# Patient Record
Sex: Male | Born: 1990 | Race: Black or African American | Hispanic: No | Marital: Single | Smoking: Never smoker
Health system: Southern US, Community
[De-identification: ages and names within clinical notes are randomized; demographics above are authoritative.]

---
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
## 2016-10-31 ENCOUNTER — Ambulatory Visit (INDEPENDENT_AMBULATORY_CARE_PROVIDER_SITE_OTHER): Payer: Self-pay | Admitting: Family Medicine

## 2016-10-31 ENCOUNTER — Encounter: Payer: Self-pay | Admitting: Family Medicine

## 2016-10-31 VITALS — BP 108/80 | HR 59 | Temp 98.3°F | Resp 16 | Ht 66.75 in | Wt 147.0 lb

## 2016-10-31 DIAGNOSIS — Z1389 Encounter for screening for other disorder: Secondary | ICD-10-CM

## 2016-10-31 DIAGNOSIS — Z1383 Encounter for screening for respiratory disorder NEC: Secondary | ICD-10-CM

## 2016-10-31 DIAGNOSIS — Z136 Encounter for screening for cardiovascular disorders: Secondary | ICD-10-CM

## 2016-10-31 DIAGNOSIS — Z1211 Encounter for screening for malignant neoplasm of colon: Secondary | ICD-10-CM

## 2016-10-31 DIAGNOSIS — Z130 Encounter for screening for diseases of the blood and blood-forming organs and certain disorders involving the immune mechanism: Secondary | ICD-10-CM

## 2016-10-31 DIAGNOSIS — Z1329 Encounter for screening for other suspected endocrine disorder: Secondary | ICD-10-CM

## 2016-10-31 DIAGNOSIS — N410 Acute prostatitis: Secondary | ICD-10-CM

## 2016-10-31 DIAGNOSIS — Z1212 Encounter for screening for malignant neoplasm of rectum: Secondary | ICD-10-CM

## 2016-10-31 DIAGNOSIS — Z113 Encounter for screening for infections with a predominantly sexual mode of transmission: Secondary | ICD-10-CM

## 2016-10-31 DIAGNOSIS — Z125 Encounter for screening for malignant neoplasm of prostate: Secondary | ICD-10-CM

## 2016-10-31 DIAGNOSIS — Z0000 Encounter for general adult medical examination without abnormal findings: Secondary | ICD-10-CM

## 2016-10-31 LAB — LIPID PANEL
CHOL/HDL RATIO: 2.2 ratio (ref <=5.0)
Cholesterol: 116 mg/dL — ABNORMAL LOW (ref 125–200)
HDL: 52 mg/dL (ref >=40)
LDL Cholesterol: 53 mg/dL (ref <130)
Triglycerides: 54 mg/dL (ref <150)
VLDL: 11 mg/dL (ref <30)

## 2016-10-31 LAB — COMPREHENSIVE METABOLIC PANEL
ALK PHOS: 42 U/L (ref 40–115)
ALT: 17 U/L (ref 9–46)
AST: 19 U/L (ref 10–40)
Albumin: 5 g/dL (ref 3.6–5.1)
BUN: 12 mg/dL (ref 7–25)
CALCIUM: 9.7 mg/dL (ref 8.6–10.3)
CHLORIDE: 102 mmol/L (ref 98–110)
CO2: 27 mmol/L (ref 20–31)
Creat: 1.09 mg/dL (ref 0.60–1.35)
GLUCOSE: 75 mg/dL (ref 65–99)
POTASSIUM: 4.2 mmol/L (ref 3.5–5.3)
Sodium: 139 mmol/L (ref 135–146)
Total Bilirubin: 2.4 mg/dL — ABNORMAL HIGH (ref 0.2–1.2)
Total Protein: 7.8 g/dL (ref 6.1–8.1)

## 2016-10-31 LAB — POCT URINALYSIS DIP (MANUAL ENTRY)
GLUCOSE UA: NEGATIVE
Leukocytes, UA: NEGATIVE
Nitrite, UA: NEGATIVE
Protein Ur, POC: 30 — AB
RBC UA: NEGATIVE
SPEC GRAV UA: 1.025
UROBILINOGEN UA: 1
pH, UA: 5.5

## 2016-10-31 LAB — CBC
HEMATOCRIT: 43.7 % (ref 38.5–50.0)
Hemoglobin: 14.9 g/dL (ref 13.2–17.1)
MCH: 31.8 pg (ref 27.0–33.0)
MCHC: 34.1 g/dL (ref 32.0–36.0)
MCV: 93.4 fL (ref 80.0–100.0)
MPV: 11.2 fL (ref 7.5–12.5)
Platelets: 217 10*3/uL (ref 140–400)
RBC: 4.68 MIL/uL (ref 4.20–5.80)
RDW: 12.3 % (ref 11.0–15.0)
WBC: 4.8 10*3/uL (ref 3.8–10.8)

## 2016-10-31 LAB — HIV ANTIBODY (ROUTINE TESTING W REFLEX): HIV 1&2 Ab, 4th Generation: NONREACTIVE

## 2016-10-31 LAB — TSH: TSH: 0.93 m[IU]/L (ref 0.40–4.50)

## 2016-10-31 LAB — PSA: PSA: 0.8 ng/mL (ref <=4.0)

## 2016-10-31 LAB — HEPATITIS C ANTIBODY: HCV Ab: NEGATIVE

## 2016-10-31 MED ORDER — DOXYCYCLINE HYCLATE 100 MG PO CAPS: 100.0000 mg | ORAL_CAPSULE | Freq: Two times a day (BID) | ORAL | 0 refills | Status: DC

## 2016-10-31 NOTE — Patient Instructions (Addendum)
IF you received an x-ray today, you will receive an invoice from [facility]. Please contact [facility] at [phone] with questions or concerns regarding your invoice.   IF you received labwork today, you will receive an invoice from [employer]. Please contact [facility] at [phone] with questions or concerns regarding your invoice.   Our billing staff will not be able to assist you with questions regarding bills from these companies.  You will be contacted with the lab results as soon as they are available. The fastest way to get your results is to activate your My Chart account. Instructions are located on the last page of this paperwork. If you have not heard from [place] regarding the results in 2 weeks, please contact this office.     Prostatitis The prostate gland is about the size and shape of a walnut. It is located just below your bladder. It produces one of the components of semen, which is made up of sperm and the fluids that help nourish and transport it out from the testicles. Prostatitis is inflammation of the prostate gland.  There are four types of prostatitis:  Acute bacterial prostatitis. This is the least common type of prostatitis. It starts quickly and usually is associated with a bladder infection, high fever, and shaking chills. It can occur at any age.  Chronic bacterial prostatitis. This is a persistent bacterial infection in the prostate. It usually develops from repeated acute bacterial prostatitis or acute bacterial prostatitis that was not properly treated. It can occur in men of any age but is most common in middle-aged men whose prostate has begun to enlarge. The symptoms are not as severe as those in acute bacterial prostatitis. Discomfort in the part of your body that is in front of your rectum and below your scrotum (perineum), lower abdomen, or in the head of your penis (glans) may represent your primary  discomfort.  Chronic prostatitis (nonbacterial). This is the most common type of prostatitis. It is inflammation of the prostate gland that is not caused by a bacterial infection. The cause is unknown and may be associated with a viral infection or autoimmune disorder.  Prostatodynia (pelvic floor disorder). This is associated with increased muscular tone in the pelvis surrounding the prostate. CAUSES The causes of bacterial prostatitis are bacterial infection. The causes of the other types of prostatitis are unknown.  SYMPTOMS  Symptoms can vary depending upon the type of prostatitis that exists. There can also be overlap in symptoms. Possible symptoms for each type of prostatitis are listed below. Acute Bacterial Prostatitis  Painful urination.  Fever or chills.  Muscle or joint pains.  Low back pain.  Low abdominal pain.  Inability to empty bladder completely. Chronic Bacterial Prostatitis, Chronic Nonbacterial Prostatitis, and Prostatodynia  Sudden urge to urinate.  Frequent urination.  Difficulty starting urine stream.  Weak urine stream.  Discharge from the urethra.  Dribbling after urination.  Rectal pain.  Pain in the testicles, penis, or tip of the penis.  Pain in the perineum.  Problems with sexual function.  Painful ejaculation.  Bloody semen. DIAGNOSIS  In order to diagnose prostatitis, your health care provider will ask about your symptoms. One or more urine samples will be taken and tested (urinalysis). If the urinalysis result is negative for bacteria, your health care provider may use a finger to feel your prostate (digital rectal exam). This exam helps your health care provider determine if your prostate is swollen and tender. It  will also produce a specimen of semen that can be analyzed. TREATMENT  Treatment for prostatitis depends on the cause. If a bacterial infection is the cause, it can be treated with antibiotic medicine. In cases of chronic  bacterial prostatitis, the use of antibiotics for up to 1 month or 6 weeks may be necessary. Your health care provider may instruct you to take sitz baths to help relieve pain. A sitz bath is a bath of hot water in which your hips and buttocks are under water. This relaxes the pelvic floor muscles and often helps to relieve the pressure on your prostate. HOME CARE INSTRUCTIONS   Take all medicines as directed by your health care provider.  Take sitz baths as directed by your health care provider. SEEK MEDICAL CARE IF:   Your symptoms get worse, not better.  You have a fever. SEEK IMMEDIATE MEDICAL CARE IF:   You have chills.  You feel nauseous or vomit.  You feel lightheaded or faint.  You are unable to urinate.  You have blood or blood clots in your urine. MAKE SURE YOU:  Understand these instructions.  Will watch your condition.  Will get help right away if you are not doing well or get worse.   This information is not intended to replace advice given to you by your health care provider. Make sure you discuss any questions you have with your health care provider.   Document Released: 12/14/2000 Document Revised: 01/07/2015 Document Reviewed: 07/06/2013 Elsevier Interactive Patient Education 2016 [employer] Health Maintenance, Male A healthy lifestyle and preventative care can promote health and wellness.  Maintain regular health, dental, and eye exams.  Eat a healthy diet. Foods like vegetables, fruits, whole grains, low-fat dairy products, and lean protein foods contain the nutrients you need and are low in calories. Decrease your intake of foods high in solid fats, added sugars, and salt. Get information about a proper diet from your health care provider, if necessary.  Regular physical exercise is one of the most important things you can do for your health. Most adults should get at least 150 minutes of moderate-intensity exercise (any activity that increases your  heart rate and causes you to sweat) each week. In addition, most adults need muscle-strengthening exercises on 2 or more days a week.   Maintain a healthy weight. The body mass index (BMI) is a screening tool to identify possible weight problems. It provides an estimate of body fat based on height and weight. Your health care provider can find your BMI and can help you achieve or maintain a healthy weight. For males 20 years and older:  A BMI below 18.5 is considered underweight.  A BMI of 18.5 to 24.9 is normal.  A BMI of 25 to 29.9 is considered overweight.  A BMI of 30 and above is considered obese.  Maintain normal blood lipids and cholesterol by exercising and minimizing your intake of saturated fat. Eat a balanced diet with plenty of fruits and vegetables. Blood tests for lipids and cholesterol should begin at age 26 and be repeated every 5 years. If your lipid or cholesterol levels are high, you are over age 26, or you are at high risk for heart disease, you may need your cholesterol levels checked more frequently.Ongoing high lipid and cholesterol levels should be treated with medicines if diet and exercise are not working.  If you smoke, find out from your health care provider how to quit. If you do not use tobacco, do  not start.  Lung cancer screening is recommended for adults aged 55-80 years who are at high risk for developing lung cancer because of a history of smoking. A yearly low-dose CT scan of the lungs is recommended for people who have at least a 30-pack-year history of smoking and are current smokers or have quit within the past 15 years. A pack year of smoking is smoking an average of 1 pack of cigarettes a day for 1 year (for example, a 30-pack-year history of smoking could mean smoking 1 pack a day for 30 years or 2 packs a day for 15 years). Yearly screening should continue until the smoker has stopped smoking for at least 15 years. Yearly screening should be stopped for  people who develop a health problem that would prevent them from having lung cancer treatment.  If you choose to drink alcohol, do not have more than 2 drinks per day. One drink is considered to be 12 oz (360 mL) of beer, 5 oz (150 mL) of wine, or 1.5 oz (45 mL) of liquor.  Avoid the use of street drugs. Do not share needles with anyone. Ask for help if you need support or instructions about stopping the use of drugs.  High blood pressure causes heart disease and increases the risk of stroke. High blood pressure is more likely to develop in:  People who have blood pressure in the end of the normal range (100-139/85-89 mm Hg).  People who are overweight or obese.  People who are African American.  If you are 1-51 years of age, have your blood pressure checked every 3-5 years. If you are 22 years of age or older, have your blood pressure checked every year. You should have your blood pressure measured twice--once when you are at a hospital or clinic, and once when you are not at a hospital or clinic. Record the average of the two measurements. To check your blood pressure when you are not at a hospital or clinic, you can use:  An automated blood pressure machine at a pharmacy.  A home blood pressure monitor.  If you are 75-3 years old, ask your health care provider if you should take aspirin to prevent heart disease.  Diabetes screening involves taking a blood sample to check your fasting blood sugar level. This should be done once every 3 years after age 65 if you are at a normal weight and without risk factors for diabetes. Testing should be considered at a younger age or be carried out more frequently if you are overweight and have at least 1 risk factor for diabetes.  Colorectal cancer can be detected and often prevented. Most routine colorectal cancer screening begins at the age of 89 and continues through age 10. However, your health care provider may recommend screening at an earlier  age if you have risk factors for colon cancer. On a yearly basis, your health care provider may provide home test kits to check for hidden blood in the stool. A small camera at the end of a tube may be used to directly examine the colon (sigmoidoscopy or colonoscopy) to detect the earliest forms of colorectal cancer. Talk to your health care provider about this at age 106 when routine screening begins. A direct exam of the colon should be repeated every 5-10 years through age 89, unless early forms of precancerous polyps or small growths are found.  People who are at an increased risk for hepatitis B should be screened for this virus.  You are considered at high risk for hepatitis B if:  You were born in a country where hepatitis B occurs often. Talk with your health care provider about which countries are considered high risk.  Your parents were born in a high-risk country and you have not received a shot to protect against hepatitis B (hepatitis B vaccine).  You have HIV or AIDS.  You use needles to inject street drugs.  You live with, or have sex with, someone who has hepatitis B.  You are a man who has sex with other men (MSM).  You get hemodialysis treatment.  You take certain medicines for conditions like cancer, organ transplantation, and autoimmune conditions.  Hepatitis C blood testing is recommended for all people born from [birth year] through 1965 and any individual with known risk factors for hepatitis C.  Healthy men should no longer receive prostate-specific antigen (PSA) blood tests as part of routine cancer screening. Talk to your health care provider about prostate cancer screening.  Testicular cancer screening is not recommended for adolescents or adult males who have no symptoms. Screening includes self-exam, a health care provider exam, and other screening tests. Consult with your health care provider about any symptoms you have or any concerns you have about testicular  cancer.  Practice safe sex. Use condoms and avoid high-risk sexual practices to reduce the spread of sexually transmitted infections (STIs).  You should be screened for STIs, including gonorrhea and chlamydia if:  You are sexually active and are younger than 24 years.  You are older than 24 years, and your health care provider tells you that you are at risk for this type of infection.  Your sexual activity has changed since you were last screened, and you are at an increased risk for chlamydia or gonorrhea. Ask your health care provider if you are at risk.  If you are at risk of being infected with HIV, it is recommended that you take a prescription medicine daily to prevent HIV infection. This is called pre-exposure prophylaxis (PrEP). You are considered at risk if:  You are a man who has sex with other men (MSM).  You are a heterosexual man who is sexually active with multiple partners.  You take drugs by injection.  You are sexually active with a partner who has HIV.  Talk with your health care provider about whether you are at high risk of being infected with HIV. If you choose to begin PrEP, you should first be tested for HIV. You should then be tested every 3 months for as long as you are taking PrEP.  Use sunscreen. Apply sunscreen liberally and repeatedly throughout the day. You should seek shade when your shadow is shorter than you. Protect yourself by wearing long sleeves, pants, a wide-brimmed hat, and sunglasses year round whenever you are outdoors.  Tell your health care provider of new moles or changes in moles, especially if there is a change in shape or color. Also, tell your health care provider if a mole is larger than the size of a pencil eraser.  A one-time screening for abdominal aortic aneurysm (AAA) and surgical repair of large AAAs by ultrasound is recommended for men aged 65-75 years who are current or former smokers.  Stay current with your vaccines  (immunizations).   This information is not intended to replace advice given to you by your health care provider. Make sure you discuss any questions you have with your health care provider.   Document Released: 06/14/2008 Document  Revised: 01/07/2015 Document Reviewed: 05/14/2011 Elsevier Interactive Patient Education 2016 Elsevier Inc.  

## 2016-10-31 NOTE — Progress Notes (Signed)
Subjective:    Patient ID: Richard Bonilla, male    DOB: 1991-09-22, 25 y.o.   MRN: [id number] Chief Complaint  Patient presents with  . Annual Exam    HPI  Drinks a lot ofwater and so has some urinary frequency. Works as a Technical sales engineermusician. Works out daily Fasting No other urnary changes Last STD screen sev yrs ago Occ urinary uregency  Vegan/pescetarian - good diet,   Depression screen PHQ 2/9 10/31/2016  Decreased Interest 0  Down, Depressed, Hopeless 0  PHQ - 2 Score 0    No past medical history on file. No past surgical history on file. No current outpatient prescriptions on file prior to visit.   No current facility-administered medications on file prior to visit.    No Known Allergies No family history on file. Social History   Social History  . Marital status: Single    Spouse name: N/A  . Number of children: N/A  . Years of education: N/A   Social History Main Topics  . Smoking status: Never Smoker  . Smokeless tobacco: None  . Alcohol use None  . Drug use: Unknown  . Sexual activity: Not Asked   Other Topics Concern  . None   Social History Narrative  . None    Review of Systems  Constitutional: Negative for activity change and appetite change.  Endocrine: Positive for polyuria. Negative for polydipsia and polyphagia.  Genitourinary: Positive for frequency, testicular pain and urgency. Negative for decreased urine volume, difficulty urinating, discharge, dysuria, enuresis, flank pain, genital sores, hematuria, penile pain, penile swelling and scrotal swelling.  Psychiatric/Behavioral: Negative for dysphoric mood. The patient is not nervous/anxious.   All other systems reviewed and are negative.      Objective:   Physical Exam  Constitutional: He is oriented to person, place, and time. He appears well-developed and well-nourished. No distress.  HENT:  Head: Normocephalic and atraumatic.  Right Ear: Tympanic membrane, external ear and ear canal  normal.  Left Ear: Tympanic membrane, external ear and ear canal normal.  Nose: Nose normal.  Mouth/Throat: Uvula is midline, oropharynx is clear and moist and mucous membranes are normal. No oropharyngeal exudate.  Eyes: Conjunctivae are normal. Right eye exhibits no discharge. Left eye exhibits no discharge. No scleral icterus.  Neck: Normal range of motion. Neck supple. No thyromegaly present.  Cardiovascular: Normal rate, regular rhythm, normal heart sounds and intact distal pulses.   Pulmonary/Chest: Effort normal and breath sounds normal. No respiratory distress.  Abdominal: Soft. Bowel sounds are normal. He exhibits no distension and no mass. There is no tenderness. There is no rebound and no guarding.  Genitourinary: Rectal exam shows tenderness. Rectal exam shows no external hemorrhoid, no internal hemorrhoid and no mass. Prostate is tender. Prostate is not enlarged.  Musculoskeletal: He exhibits no edema.  Lymphadenopathy:    He has no cervical adenopathy.  Neurological: He is alert and oriented to person, place, and time. He has normal reflexes. No cranial nerve deficit. He exhibits normal muscle tone.  Skin: Skin is warm and dry. No rash noted. He is not diaphoretic. No erythema.  Psychiatric: He has a normal mood and affect. His behavior is normal.      BP 108/80   Pulse (!) 59   Temp 98.3 F (36.8 C) (Oral)   Resp 16   Ht 5' 6.75" (1.695 m)   Wt 147 lb (66.7 kg)   SpO2 99%   BMI 23.20 kg/m      Assessment &  Plan:   1. Annual physical exam   2. Routine screening for STI (sexually transmitted infection)   3. Screening for cardiovascular, respiratory, and genitourinary diseases   4. Screening for colorectal cancer   5. Screening for deficiency anemia   6. Screening for prostate cancer   7. Screening for thyroid disorder   8. Acute prostatitis     Orders Placed This Encounter  Procedures  . Urine culture  . GC/Chlamydia Probe Amp  . PSA  . RPR  . TSH  .  HIV antibody  . Hepatitis C Antibody  . CBC  . Comprehensive metabolic panel    Order Specific Question:   Has the patient fasted?    Answer:   Yes  . Lipid panel    Order Specific Question:   Has the patient fasted?    Answer:   Yes  . Care order/instruction:    AVS and GO    Scheduling Instructions:     AVS and GO  . POCT urinalysis dipstick  . POCT Microscopic Urinalysis ([facility])    Meds ordered this encounter  Medications  . doxycycline (VIBRAMYCIN) 100 MG capsule    Sig: Take 1 capsule (100 mg total) by mouth 2 (two) times daily.    Dispense:  42 capsule    Refill:  0      Norberto SorensonEva Onisha Cedeno, M.D.  [facility]  [place] [address] [place], [place] [address] [phone] phone [phone] fax  11/05/16 12:04 AM

## 2016-11-01 LAB — URINE CULTURE: ORGANISM ID, BACTERIA: NO GROWTH

## 2016-11-01 LAB — GC/CHLAMYDIA PROBE AMP
CT PROBE, AMP APTIMA: NOT DETECTED
GC PROBE AMP APTIMA: NOT DETECTED

## 2016-11-01 LAB — RPR

## 2016-11-05 ENCOUNTER — Encounter: Payer: Self-pay | Admitting: Family Medicine

## 2016-11-07 ENCOUNTER — Ambulatory Visit (INDEPENDENT_AMBULATORY_CARE_PROVIDER_SITE_OTHER): Payer: Self-pay | Admitting: Emergency Medicine

## 2016-11-07 ENCOUNTER — Ambulatory Visit (INDEPENDENT_AMBULATORY_CARE_PROVIDER_SITE_OTHER): Payer: Self-pay

## 2016-11-07 VITALS — BP 128/82 | HR 88 | Temp 98.2°F | Resp 16 | Wt 144.8 lb

## 2016-11-07 DIAGNOSIS — R1030 Lower abdominal pain, unspecified: Secondary | ICD-10-CM

## 2016-11-07 DIAGNOSIS — N410 Acute prostatitis: Secondary | ICD-10-CM

## 2016-11-07 LAB — POCT URINALYSIS DIP (MANUAL ENTRY)
BILIRUBIN UA: NEGATIVE
Bilirubin, UA: NEGATIVE
Blood, UA: NEGATIVE
Glucose, UA: NEGATIVE
LEUKOCYTES UA: NEGATIVE
NITRITE UA: NEGATIVE
PROTEIN UA: NEGATIVE
Spec Grav, UA: 1.015
Urobilinogen, UA: 0.2
pH, UA: 5.5

## 2016-11-07 LAB — POC MICROSCOPIC URINALYSIS (UMFC): MUCUS RE: ABSENT

## 2016-11-07 NOTE — Patient Instructions (Addendum)
Please make the dietary changes we discussed. Please call if no better in 2 weeks we'll proceed with CT abdomen pelvis.Irritable Bowel Syndrome, Adult Irritable bowel syndrome (IBS) is not one specific disease. It is a group of symptoms that affects the organs responsible for digestion (gastrointestinal or GI tract).  To regulate how your GI tract works, your body sends signals back and forth between your intestines and your brain. If you have IBS, there may be a problem with these signals. As a result, your GI tract does not function normally. Your intestines may become more sensitive and overreact to certain things. This is especially true when you eat certain foods or when you are under stress.  There are four types of IBS. These may be determined based on the consistency of your stool:   IBS with diarrhea.   IBS with constipation.   Mixed IBS.   Unsubtyped IBS.  It is important to know which type of IBS you have. Some treatments are more likely to be helpful for certain types of IBS.  CAUSES  The exact cause of IBS is not known. RISK FACTORS You may have a higher risk of IBS if:  You are a woman.  You are younger than 26 years old.  You have a family history of IBS.  You have mental health problems.  You have had bacterial infection of your GI tract. SIGNS AND SYMPTOMS  Symptoms of IBS vary from person to person. The main symptom is abdominal pain or discomfort. Additional symptoms usually include one or more of the following:   Diarrhea, constipation, or both.   Abdominal swelling or bloating.   Feeling full or sick after eating a small or regular-size meal.   Frequent gas.   Mucus in the stool.   A feeling of having more stool left after a bowel movement.  Symptoms tend to come and go. They may be associated with stress, psychiatric conditions, or nothing at all.  DIAGNOSIS  There is no specific test to diagnose IBS. Your health care provider will make a  diagnosis based on a physical exam, medical history, and your symptoms. You may have other tests to rule out other conditions that may be causing your symptoms. These may include:   Blood tests.   X-rays.   CT scan.  Endoscopy and colonoscopy. This is a test in which your GI tract is viewed with a long, thin, flexible tube. TREATMENT There is no cure for IBS, but treatment can help relieve symptoms. IBS treatment often includes:   Changes to your diet, such as:  Eating more fiber.  Avoiding foods that cause symptoms.  Drinking more water.  Eating regular, medium-sized portioned meals.  Medicines. These may include:  Fiber supplements if you have constipation.  Medicine to control diarrhea (antidiarrheal medicines).  Medicine to help control muscle spasms in your GI tract (antispasmodic medicines).  Medicines to help with any mental health issues, such as antidepressants or tranquilizers.  Therapy.  Talk therapy may help with anxiety, depression, or other mental health issues that can make IBS symptoms worse.  Stress reduction.  Managing your stress can help keep symptoms under control. HOME CARE INSTRUCTIONS   Take medicines only as directed by your health care provider.  Eat a healthy diet.  Avoid foods and drinks with added sugar.  Include more whole grains, fruits, and vegetables gradually into your diet. This may be especially helpful if you have IBS with constipation.  Avoid any foods and drinks that  make your symptoms worse. These may include dairy products and caffeinated or carbonated drinks.  Do not eat large meals.  Drink enough fluid to keep your urine clear or pale yellow.  Exercise regularly. Ask your health care provider for recommendations of good activities for you.  Keep all follow-up visits as directed by your health care provider. This is important. SEEK MEDICAL CARE IF:   You have constant pain.  You have trouble or pain with  swallowing.  You have worsening diarrhea. SEEK IMMEDIATE MEDICAL CARE IF:   You have severe and worsening abdominal pain.   You have diarrhea and:   You have a rash, stiff neck, or severe headache.   You are irritable, sleepy, or difficult to awaken.   You are weak, dizzy, or extremely thirsty.   You have bright red blood in your stool or you have black tarry stools.   You have unusual abdominal swelling that is painful.   You vomit continuously.   You vomit blood (hematemesis).   You have both abdominal pain and a fever.    This information is not intended to replace advice given to you by your health care provider. Make sure you discuss any questions you have with your health care provider.   Document Released: 12/17/2005 Document Revised: 01/07/2015 Document Reviewed: 09/03/2014 Elsevier Interactive Patient Education 2016 [employer]     IF you received an x-ray today, you will receive an invoice from [facility]. Please contact [facility] at [phone] with questions or concerns regarding your invoice.   IF you received labwork today, you will receive an invoice from [facility]. Please contact [facility] at [phone] with questions or concerns regarding your invoice.   Our billing staff will not be able to assist you with questions regarding bills from these companies.  You will be contacted with the lab results as soon as they are available. The fastest way to get your results is to activate your My Chart account. Instructions are located on the last page of this paperwork. If you have not heard from us regarding the results in 2 weeks, please contact this office.

## 2016-11-07 NOTE — Progress Notes (Signed)
Chief Complaint:  Chief Complaint  Patient presents with  . Follow-up    pressure in lower abd. still on antibiotics for prostititis    HPI: Richard Bonilla is a 25 y.o. male [facility] complaining ofPersistent low abdominal pain. He states his original symptoms started about 2 months ago with a sensation of an urge to urinate. He has intermittently had aching in his prostate and testicles. He was seen by Dr. Clelia CroftShaw and started on doxycycline. He has had a change in his diet recently and is concerned possibly this is attributing to his low abdominal pain. He is able to urinate with a normal stream. He has having regular bowel movements. He does not feel ill. He denies any fever.  No past medical history on file. No past surgical history on file. Social History   Social History  . Marital status: Single    Spouse name: N/A  . Number of children: N/A  . Years of education: N/A   Social History Main Topics  . Smoking status: Never Smoker  . Smokeless tobacco: Never Used  . Alcohol use None  . Drug use: Unknown  . Sexual activity: Not Asked   Other Topics Concern  . None   Social History Narrative  . None   No family history on file. No Known Allergies Prior to Admission medications   Medication Sig Start Date End Date Taking? Authorizing Provider  doxycycline (VIBRAMYCIN) 100 MG capsule Take 1 capsule (100 mg total) by mouth 2 (two) times daily. 10/31/16  Yes Sherren MochaEva N Shaw, MD     ROS: The patient denies fevers, chills, night sweats, unintentional weight loss, chest pain, palpitations, wheezing, dyspnea on exertion, nausea, vomiting, abdominal pain, dysuria, hematuria, melena, numbness, weakness, or tingling.  All other systems have been reviewed and were otherwise negative with the exception of those mentioned in the HPI and as above.    PHYSICAL EXAM: Vitals:   11/07/16 0913  BP: 128/82  Pulse: 88  Resp: 16  Temp: 98.2 F (36.8 C)   Body mass index  is 22.85 kg/m.   General: Alert, no acute distress HEENT:  Normocephalic, atraumatic, oropharynx patent. [facility] Cardiovascular:  Regular rate and rhythm, no rubs murmurs or gallops.  No Carotid bruits, radial pulse intact. No pedal edema.  Respiratory: Clear to auscultation bilaterally.  No wheezes, rales, or rhonchi.  No cyanosis, no use of accessory musculature Abdominal: No organomegaly, abdomen is soft and non-tender, positive bowel sounds.  No masses. Musculoskeletal: Gait intact. No edema, tenderness Skin: No rashes. Neurologic: Facial musculature symmetric. Psychiatric: Patient acts appropriately throughout our interaction. Lymphatic: No cervical or submandibular lymphadenopathy    LABS: Results for orders placed or performed in visit on 11/07/16  POCT urinalysis dipstick  Result Value Ref Range   Color, UA yellow yellow   Clarity, UA clear clear   Glucose, UA negative negative   Bilirubin, UA negative negative   Ketones, POC UA negative negative   Spec Grav, UA 1.015    Blood, UA negative negative   pH, UA 5.5    Protein Ur, POC negative negative   Urobilinogen, UA 0.2    Nitrite, UA Negative Negative   Leukocytes, UA Negative Negative  POCT Microscopic Urinalysis ([facility])  Result Value Ref Range   WBC,UR,HPF,POC None None WBC/hpf   RBC,UR,HPF,POC None None RBC/hpf   Bacteria None None, Too numerous to count   Mucus Absent Absent   Epithelial Cells, UR  Per Microscopy None None, Too numerous to count cells/hpf    EKG/XRAY:   Primary read [facility]. Dg Abd Acute W/chest  Result Date: 11/07/2016 CLINICAL DATA:  Lower abdominal pain EXAM: DG ABDOMEN ACUTE W/ 1V CHEST COMPARISON:  None in PACs FINDINGS: The lungs are well-expanded and clear. The heart and pulmonary vascularity are normal. The mediastinum is normal in width. There is no pleural effusion. The bony thorax is unremarkable. Within the abdomen the bowel gas pattern is  normal. No abnormal soft tissue calcifications are observed. The bony structures are unremarkable. IMPRESSION: There is no acute intra-abdominal abnormality. There is no acute cardiopulmonary disease. Electronically Signed   By: David  SwazilandJordan M.D.   On: 11/07/2016 10:02    ASSESSMENT/PLAN: We'll treat as irritable bowel syndrome with dietary changes. He will increase the fiber in his diet. He will pay attention to lactose intake. If no better in 2 weeks he will call our office and we will schedule with a CT abdomen pelvis with contrast.   Gross sideeffects, risk and benefits, and alternatives of medications d/w patient. Patient is aware that all medications have potential sideeffects and we are unable to predict every sideeffect or drug-drug interaction that may occur.  Lesle ChrisSteven Kaleem Sartwell MD 11/07/2016 9:37 AM

## 2016-11-08 LAB — GC/CHLAMYDIA PROBE AMP
CT PROBE, AMP APTIMA: NOT DETECTED
GC PROBE AMP APTIMA: NOT DETECTED

## 2017-10-21 ENCOUNTER — Ambulatory Visit (INDEPENDENT_AMBULATORY_CARE_PROVIDER_SITE_OTHER): Payer: Self-pay | Admitting: Physician Assistant

## 2017-10-21 ENCOUNTER — Encounter: Payer: Self-pay | Admitting: Physician Assistant

## 2017-10-21 VITALS — BP 118/88 | HR 65 | Temp 98.3°F | Resp 16 | Ht 65.0 in | Wt 142.8 lb

## 2017-10-21 DIAGNOSIS — H5310 Unspecified subjective visual disturbances: Secondary | ICD-10-CM

## 2017-10-21 NOTE — Patient Instructions (Addendum)
  Your vision and exam are not telling of what is going on.  I will refer you to opthalmology.  Please await this contact. I would like you to refrain from playing vide games at this time.       IF you received an x-ray today, you will receive an invoice from [facility]. Please contact [facility] at [phone] with questions or concerns regarding your invoice.   IF you received labwork today, you will receive an invoice from [facility]. Please contact [facility] at [phone] with questions or concerns regarding your invoice.   Our billing staff will not be able to assist you with questions regarding bills from these companies.  You will be contacted with the lab results as soon as they are available. The fastest way to get your results is to activate your My Chart account. Instructions are located on the last page of this paperwork. If you have not heard from us regarding the results in 2 weeks, please contact this office.

## 2017-10-21 NOTE — Progress Notes (Signed)
PRIMARY CARE AT [facility] [address], [place] [address] [phone]  Date:  10/21/2017   Name:  Richard Bonilla   DOB:  09-04-1991   MRN:  [id number]  PCP:  Patient, No Pcp Per    History of Present Illness:  Richard Bonilla is a 26 y.o. male patient who presents to PCP with  Chief Complaint  Patient presents with  . Eye Problem    RIGHT - per patient feels like a strain  x 1 month     Patient has had pressure in his right eye for 1 month. He plays video games.   He stopped drinking 1 month ago as well Some mariijuana use.  He felt very intense pressure while he was smoking.  In the last week, he has stopped all cannabis use and it still has that pressure.  No change in vision.   He noted no new eye redness with this episode.  He has noticed some eye watering.   Last time, eyes checked 10 months ago approximately.   He wears glasses for the last year.   Aggravated by playing his video games.  Relieved by not playing. Eye drops which helped minimally.    There are no active problems to display for this patient.   No past medical history on file.  No past surgical history on file.  Social History  Substance Use Topics  . Smoking status: Never Smoker  . Smokeless tobacco: Never Used  . Alcohol use Not on file    No family history on file.  No Known Allergies  Medication list has been reviewed and updated.  No current outpatient prescriptions on file prior to visit.   No current facility-administered medications on file prior to visit.     ROS ROS otherwise unremarkable unless listed above.  Physical Examination: BP 118/88 (BP Location: Left Arm, Patient Position: Sitting, Cuff Size: Normal)   Pulse 65   Temp 98.3 F (36.8 C) (Oral)   Resp 16   Ht 5\' 5"  (1.651 m)   Wt 142 lb 12.8 oz (64.8 kg)   SpO2 100%   BMI 23.76 kg/m  Ideal Body Weight: Weight in (lb) to have BMI = 25: 149.9  Physical Exam  Constitutional: He is oriented to person, place, and time. He  appears well-developed and well-nourished. No distress.  HENT:  Head: Normocephalic and atraumatic.  Eyes: Pupils are equal, round, and reactive to light. Conjunctivae and EOM are normal.  Fundoscopic exam:      The right eye shows no AV nicking and no papilledema. The right eye shows no red reflex.  Slit lamp exam:      The right eye shows no foreign body.  Limited exam due lack of dilation.  Cardiovascular: Normal rate and regular rhythm.  Exam reveals no friction rub.   No murmur heard. Pulmonary/Chest: Effort normal. No respiratory distress.  Neurological: He is alert and oriented to person, place, and time.  Skin: Skin is warm and dry. He is not diaphoretic.  Psychiatric: He has a normal mood and affect. His behavior is normal.     Assessment and Plan: Richard Bonilla is a 26 y.o. male who is here today for cc of  Chief Complaint  Patient presents with  . Eye Problem    RIGHT - per patient feels like a strain  x 1 month  possible eye strain.  Referring to groat eye.  Appointment made.    Eye strain, right  [place], [place]  [facility] 10/23/20189:44 AM

## 2017-12-02 IMAGING — DX DG ABDOMEN ACUTE W/ 1V CHEST
3 series · 3 of 3 positions shown · non-contrast
Comparison: None in PACs

CLINICAL DATA: Lower abdominal pain

EXAM:
DG ABDOMEN ACUTE W/ 1V CHEST

[chest pa]
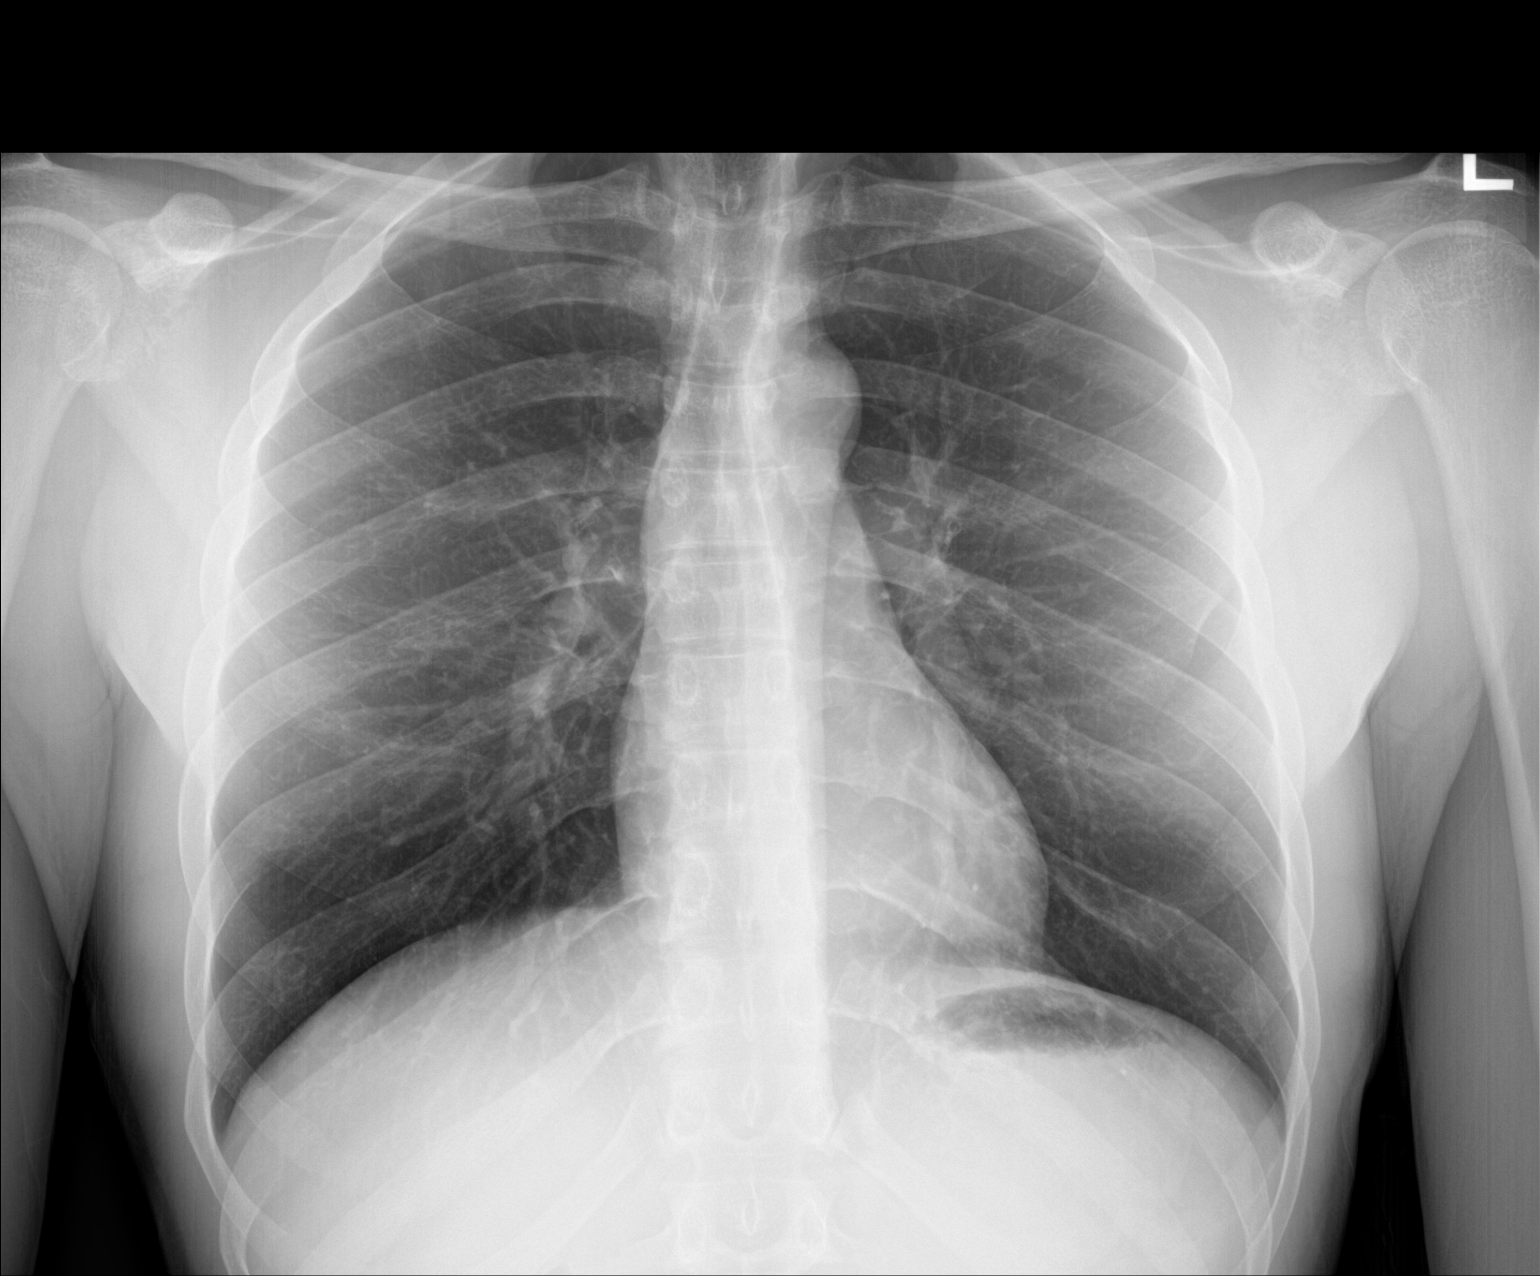

[abdomen erect]
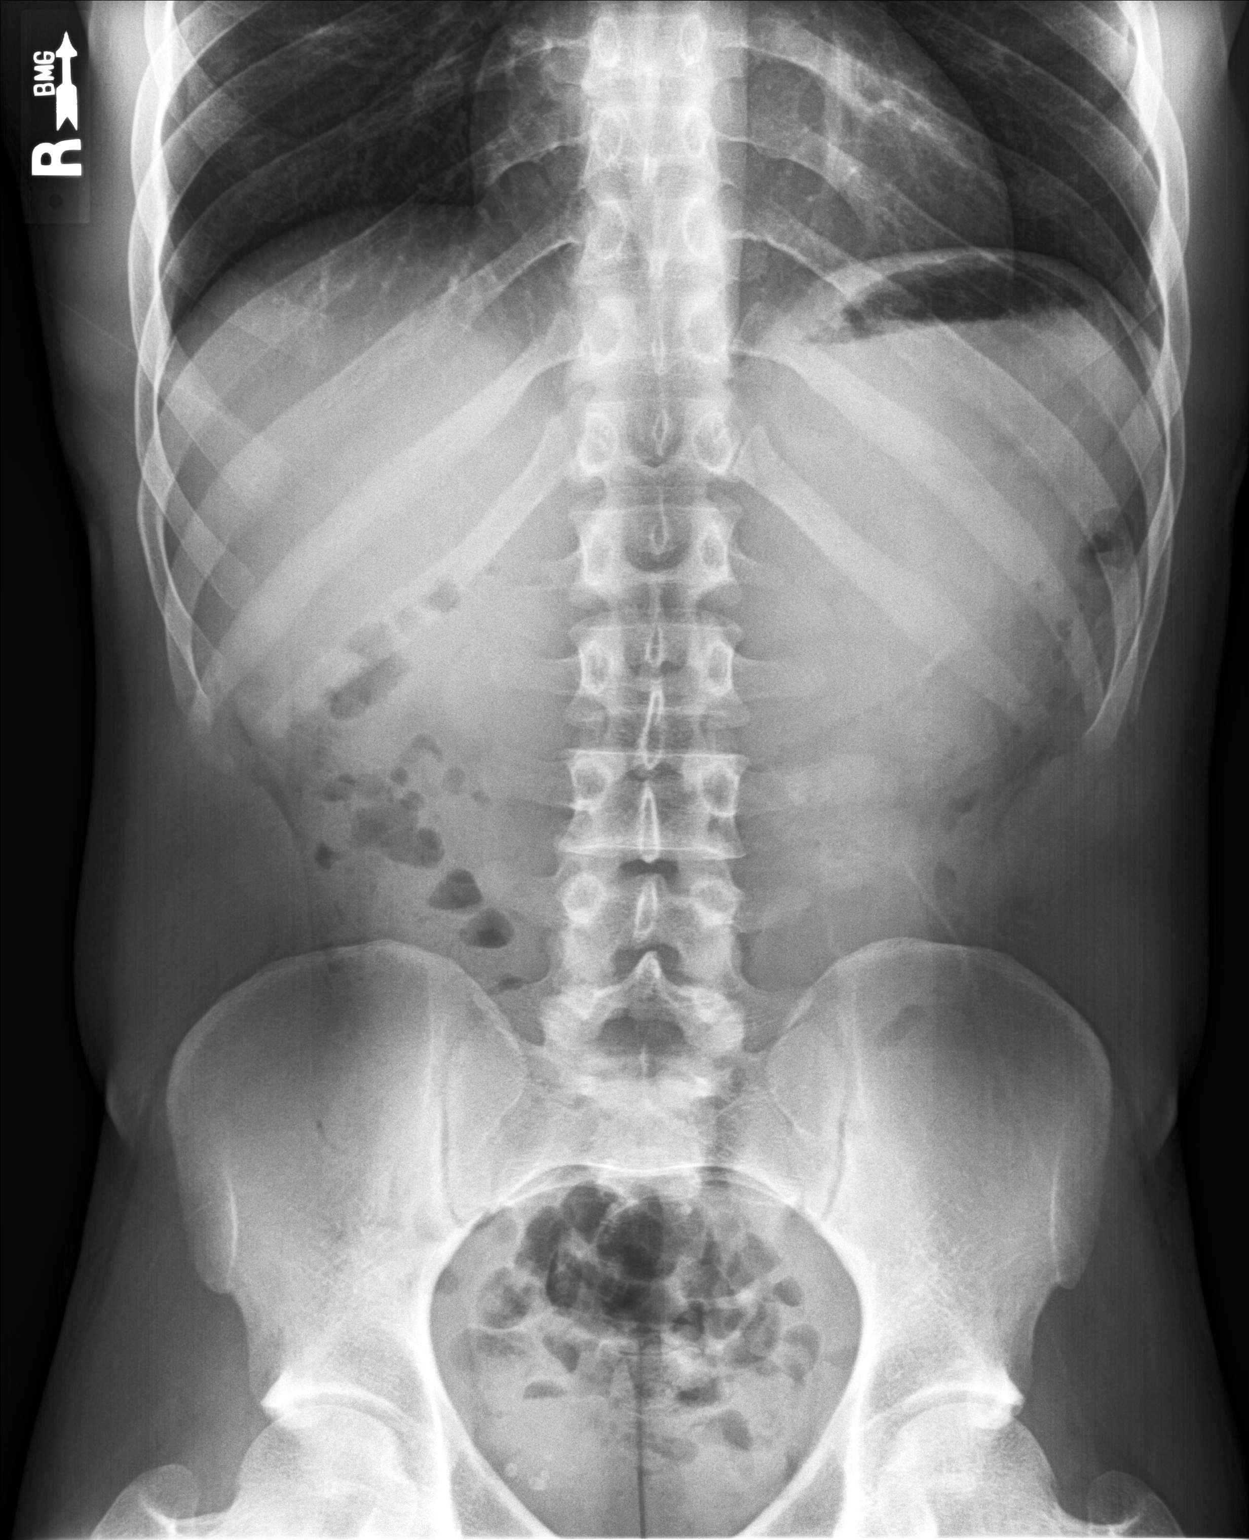

[abdomen supine]
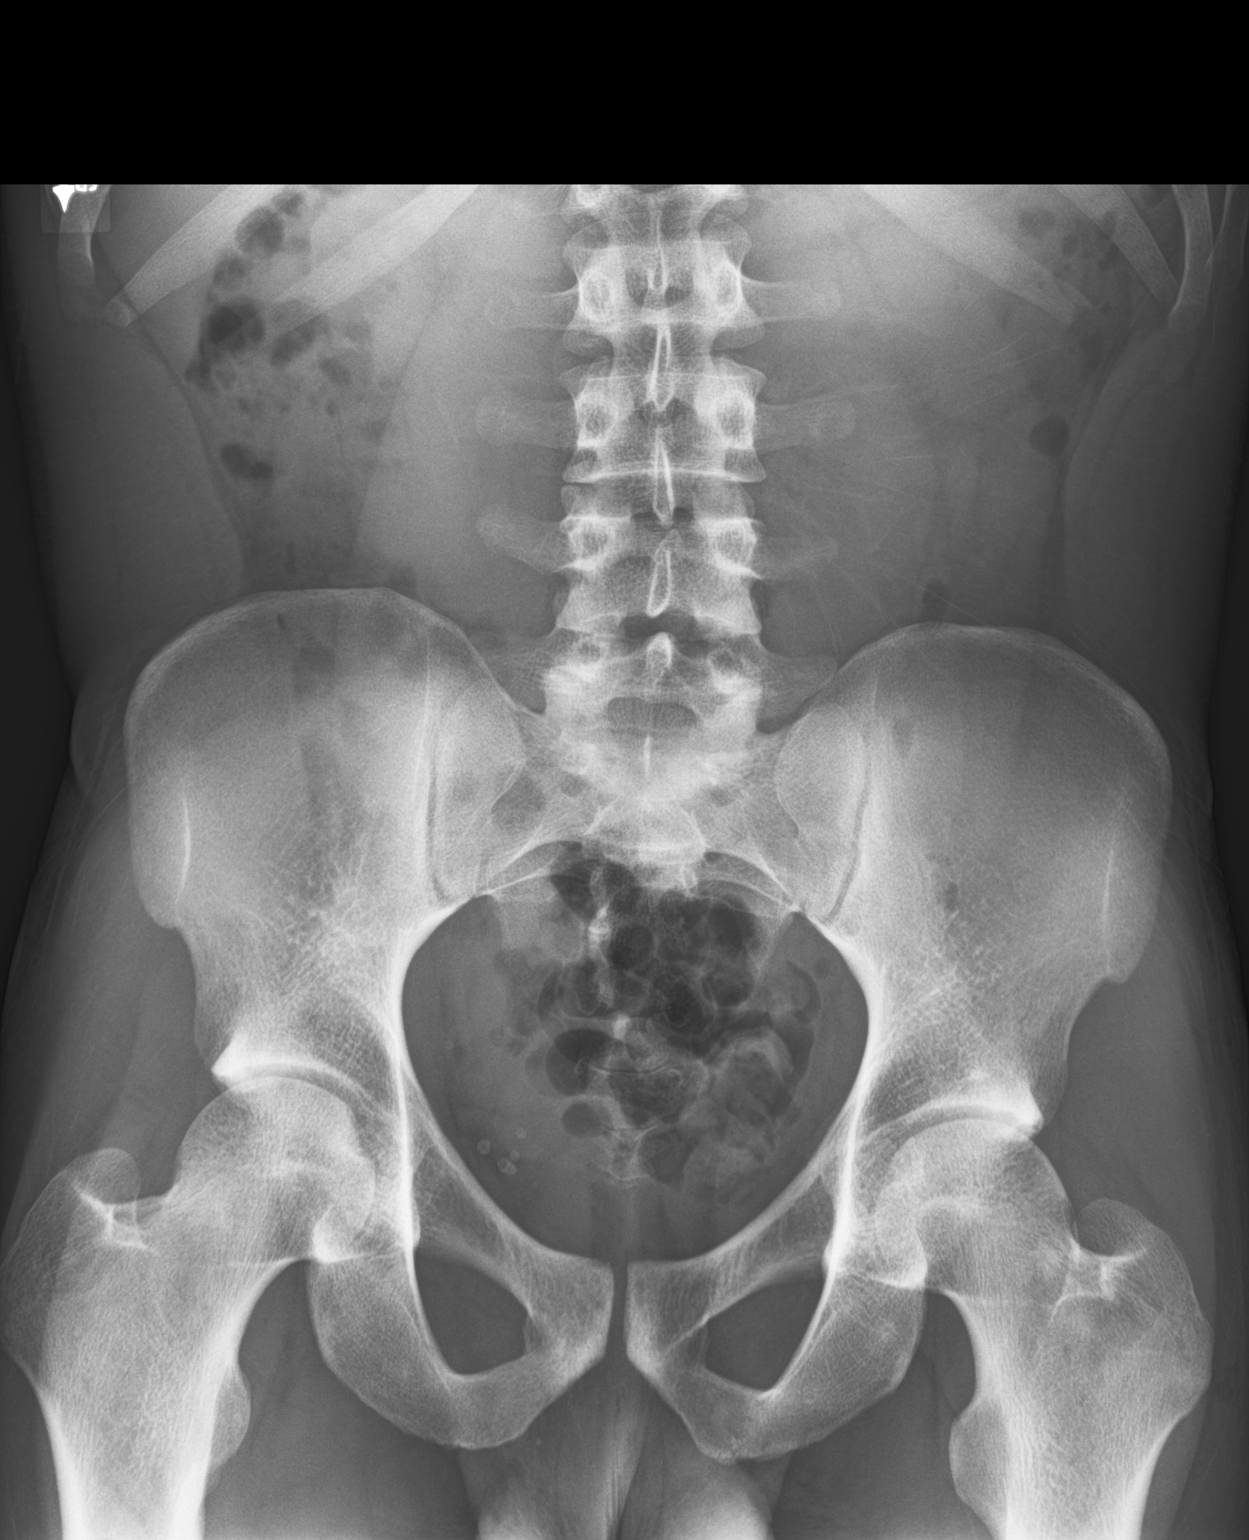

[3 of 3 positions shown; findings below may reference images not displayed]

FINDINGS: The lungs are well-expanded and clear. The heart and pulmonary
vascularity are normal. The mediastinum is normal in width. There is
no pleural effusion. The bony thorax is unremarkable.

Within the abdomen the bowel gas pattern is normal. No abnormal soft
tissue calcifications are observed. The bony structures are
unremarkable.
IMPRESSION: There is no acute intra-abdominal abnormality. There is no acute
cardiopulmonary disease.

## 2018-03-31 ENCOUNTER — Encounter: Payer: Self-pay | Admitting: Physician Assistant

## 2019-01-10 ENCOUNTER — Encounter (INDEPENDENT_AMBULATORY_CARE_PROVIDER_SITE_OTHER): Payer: Self-pay | Admitting: Family

## 2019-01-10 ENCOUNTER — Ambulatory Visit (FREE_STANDING_LABORATORY_FACILITY): Payer: BC Managed Care – PPO | Admitting: Family

## 2019-01-10 VITALS — BP 126/78 | HR 98 | Temp 99.9°F | Resp 16 | Wt 145.0 lb

## 2019-01-10 DIAGNOSIS — R509 Fever, unspecified: Secondary | ICD-10-CM

## 2019-01-10 DIAGNOSIS — J020 Streptococcal pharyngitis: Secondary | ICD-10-CM

## 2019-01-10 DIAGNOSIS — J029 Acute pharyngitis, unspecified: Secondary | ICD-10-CM

## 2019-01-10 LAB — POCT RAPID STREP A: Rapid Strep A Screen POCT: NEGATIVE

## 2019-01-10 LAB — POCT INFECTIOUS MONONUCLEOSIS ANTIBODY: POCT Infectious Mono Heterophile Antibodies: NEGATIVE

## 2019-01-10 LAB — POCT INFLUENZA A/B
POCT Rapid Influenza A AG: NEGATIVE
POCT Rapid Influenza B AG: NEGATIVE

## 2019-01-10 MED ORDER — DEXAMETHASONE SODIUM PHOSPHATE 10 MG/ML IJ SOLN
15.00 mg | Freq: Once | INTRAMUSCULAR | Status: AC
Start: 2019-01-10 — End: 2019-01-10
  Administered 2019-01-10: 19:00:00 15 mg via ORAL

## 2019-01-10 MED ORDER — CEPHALEXIN 500 MG PO CAPS
500.00 mg | ORAL_CAPSULE | Freq: Two times a day (BID) | ORAL | 0 refills | Status: AC
Start: 2019-01-10 — End: 2019-01-20

## 2019-01-10 MED ORDER — IBUPROFEN 100 MG/5ML PO SUSP
200.00 mg | Freq: Once | ORAL | Status: AC
Start: 2019-01-10 — End: 2019-01-10
  Administered 2019-01-10: 19:00:00 200 mg via ORAL

## 2019-01-10 NOTE — Patient Instructions (Signed)
Start your antibiotics tonight.   Drink plenty of fluids.  Motrin 600mg  every 6-8 hours as needed  Tylenol 1000mg  every 4-6 hours as needed.   Give tylenol and motrin for pain for next 48-72 hours.       SORE THROAT:  If any drooling, throat tightness, muffled or hot potato voice, neck stiffness or fever  go to ER immediately.   Follow up with your pediatrician if continued sore throat in 48-72 hours.   Offer cold and soft foods (jello, pudding, popscicles, smoothies) and drink plenty of fluids until sore throat improves.   If applicable: Gargle equal parts of children's liquid benadryl and liquid maalox. Mix 1 tablespoon of each, gargle and swish for 1 minute and then spit out. Do now swallow.       Pharyngitis: Strep (Presumed)    You have pharyngitis (sore throat). The healthcare staff think your sore throat is caused by streptococcus (strep)bacteria.This is often called strep throat. Strep throat can cause throat pain that is worse when swallowing, aching all over, headache,and fever. The infection is contagious. It may be spread by coughing, kissing,or touching others after touching your mouth or nose. Antibiotic medicine is given to treat the infection.  Home care   Rest at home. Drink plenty of fluids so you won't get dehydrated.   Stay home from work or school for the first 2 days of taking the antibiotics. After this time, you will not be contagious. You can then return to work or school if you are feeling better.   Take the antibiotic medicine for the full 10 days, even when you feel better. This is very important to make sure the infection is fully treated. It is also important to prevent medicine-resistant germs from growing.If you were given an antibiotic shot, no more antibiotics are needed.   You may use acetaminophen or ibuprofen to control pain or fever, unless another medicine was prescribed for this. If you have chronic liver or kidney disease or ever had a stomach ulcer or GI bleeding,  talk with your healthcare provider before using these medicines.   Use throat lozenges or a throat-numbing spray to help reduce throat pain. Gargling with warm salt water can also help reduce throat pain. Dissolve 1/2 teaspoon of salt in 1 glass of warm water.   Don't eat salty or spicy foods. These can irritate the throat.  Follow-up care  Follow up with your healthcare provider or our staff if you don't get better over the next week.  When to seek medical advice  Call your healthcare provider right away if any of these occur:   Feveras directed by your healthcare provider   New or worse ear pain, sinus pain, or headache   Painful lumps in the back of neck   Stiff neck   Lymph nodes that get larger   Can't swallow liquids, a lot of drooling,or can't open mouth wide due to throat pain   Signs of dehydration, such as very dark urine or no urine, sunken eyes, dizziness   Trouble breathing or noisy breathing   Muffled voice   New rash  Prevention  Here are steps you can take to help prevent an infection:   Keep good hand washing habits.   Don't have close contact with people who have sore throats, colds, or other upper respiratory infections.   Don't smoke, and stay away from secondhand smoke.   Stay up to date with of your vaccines.  [employer] last reviewed this  educational content on 10/31/2016   2000-2019 [employer] [address], [place] [address]. All rights reserved. This information is not intended as a substitute for professional medical care. Always follow your healthcare professional's instructions.

## 2019-01-10 NOTE — Progress Notes (Signed)
[facility]  PROGRESS NOTE     Patient: Thomas Schmitt   Date: 01/10/2019   MRN: [id number]       Thomas Schmitt is a 28 y.o. male       HISTORY     Chief Complaint   Patient presents with   . Sore Throat     x5 days, took advil at 1700        Patient is a 28y male with no pmh here for 5 days of sore throat and low grade fever. Intermittent chills. No body aches, cough or headache. No sick contacts. Extreme fatigue. [facility] and had a negative flu and strep test. Since the visit he feels like this throat has gotten bigger. Trouble eating and drinking.   No vomiting, diarrhea, rash, neck pain, photophobia. No heart, kidney problems, seizures or asthma. No other travel. No hx of nebulizer use.   No recent illness in past month       History provided by:  Patient  Language interpreter used: No    Sore Throat    This is a new problem. The current episode started in the past 7 days. The problem has been unchanged. There has been no fever. Pertinent negatives include no abdominal pain, congestion, coughing, diarrhea, ear pain, headaches, neck pain, shortness of breath, trouble swallowing or vomiting. He has had no exposure to strep or mono.       Review of Systems   Constitutional: Positive for chills and fatigue. Negative for fever.   HENT: Positive for sore throat. Negative for congestion, ear pain, rhinorrhea, sinus pressure and trouble swallowing.    Eyes: Negative for discharge and redness.   Respiratory: Negative for cough, chest tightness and shortness of breath.    Cardiovascular: Negative for chest pain.   Gastrointestinal: Negative for abdominal pain, diarrhea, nausea and vomiting.   Musculoskeletal: Negative for myalgias, neck pain and neck stiffness.   Skin: Negative for rash.   Allergic/Immunologic: Negative for environmental allergies.   Neurological: Negative for dizziness and headaches.   Hematological: Negative for adenopathy.   Psychiatric/Behavioral: Negative for  sleep disturbance.   All other systems reviewed and are negative.      History:  History reviewed. No pertinent past medical history.    History reviewed. No pertinent surgical history.    History reviewed. No pertinent family history.    Social History     Socioeconomic History   . Marital status: Single     Spouse name: Not on file   . Number of children: Not on file   . Years of education: Not on file   . Highest education level: Not on file   Occupational History   . Not on file   Social Needs   . Financial resource strain: Not on file   . Food insecurity:     Worry: Not on file     Inability: Not on file   . Transportation needs:     Medical: Not on file     Non-medical: Not on file   Tobacco Use   . Smoking status: Never Smoker   . Smokeless tobacco: Never Used   Substance and Sexual Activity   . Alcohol use: Yes     Alcohol/week: 3.0 standard drinks     Types: 3 Cans of beer per week   . Drug use: Yes     Types: Marijuana   . Sexual activity: Not on file  Lifestyle   . Physical activity:     Days per week: Not on file     Minutes per session: Not on file   . Stress: Not on file   Relationships   . Social connections:     Talks on phone: Not on file     Gets together: Not on file     Attends religious service: Not on file     Active member of club or organization: Not on file     Attends meetings of clubs or organizations: Not on file     Relationship status: Not on file   . Intimate partner violence:     Fear of current or ex partner: Not on file     Emotionally abused: Not on file     Physically abused: Not on file     Forced sexual activity: Not on file   Other Topics Concern   . Not on file   Social History Narrative   . Not on file       History reviewed.        Current Outpatient Medications:   .  cephalexin (KEFLEX) 500 MG capsule, Take 1 capsule (500 mg total) by mouth 2 (two) times daily for 10 days, Disp: 20 capsule, Rfl: 0  No current facility-administered medications for this visit.     No Known  Allergies    Pcp, None, MD    Immunizations:  UTD     PHYSICAL EXAM     Vitals:    01/10/19 1824   BP: 126/78   Pulse: 98   Resp: 16   Temp: 99.9 F (37.7 C)   SpO2: 98%   Weight: 65.8 kg (145 lb)       Physical Exam  Vitals signs and nursing note reviewed.   Constitutional:       Appearance: Normal appearance. He is well-developed. He is not ill-appearing.   HENT:      Head: Normocephalic.      Right Ear: Tympanic membrane normal. No middle ear effusion.      Left Ear: Tympanic membrane normal.  No middle ear effusion.      Nose: No mucosal edema or rhinorrhea.      Mouth/Throat:      Mouth: No oral lesions.      Pharynx: Uvula midline. Pharyngeal swelling, oropharyngeal exudate and posterior oropharyngeal erythema present.      Tonsils: Tonsillar exudate present. Swelling: 3+ on the right. 3+ on the left.      Comments: Left tonsil with exudates  Posterior pharynx and tonsils markedly red.   + petechiae.   Eyes:      General: Lids are normal.      Conjunctiva/sclera: Conjunctivae normal.      Pupils: Pupils are equal, round, and reactive to light.   Neck:      Musculoskeletal: Full passive range of motion without pain and normal range of motion. Normal range of motion. No neck rigidity or spinous process tenderness.      Trachea: Phonation normal.   Cardiovascular:      Rate and Rhythm: Normal rate and regular rhythm.      Pulses: Normal pulses.           Radial pulses are 2+ on the right side and 2+ on the left side.      Heart sounds: Normal heart sounds, S1 normal and S2 normal.   Pulmonary:      Effort: Pulmonary effort is normal.  No respiratory distress.      Breath sounds: Normal breath sounds. No decreased breath sounds, wheezing or rhonchi.   Abdominal:      General: Abdomen is flat. There is no distension.      Palpations: Abdomen is soft.      Tenderness: There is no abdominal tenderness.   Musculoskeletal: Normal range of motion.   Lymphadenopathy:      Cervical: No cervical adenopathy.      Right  cervical: No posterior cervical adenopathy.     Left cervical: No posterior cervical adenopathy.   Skin:     General: Skin is warm.      Capillary Refill: Capillary refill takes less than 2 seconds.      Findings: No rash.   Neurological:      Mental Status: He is alert and oriented to person, place, and time.      GCS: GCS eye subscore is 4. GCS verbal subscore is 5. GCS motor subscore is 6.      Gait: Gait normal.   Psychiatric:         Speech: Speech normal.         Behavior: Behavior normal.           MEDICAL DECISION MAKING     DDX:  Pharyngitis (strep, viral, non-strep)  PTA, RPA  URI  Influenza  AOM  PNA  Mono    VSS, well appearing. Tolerated water in office. Decadron given and gave remaining dose of motrin as he took 400mg  1 hour ago. Flu and strep negative. Will send culture. Patient has exudates only on one side with marked redness, cellulitis throughout with petechiae. Will treat for bacterial infection with keflex. Discussed possibility of mono and rash. If symptoms improve in 48-72 hours can con't until culture results come back.     [facility] COURSE       Results     Procedure Component Value Units Date/Time    Mono Screen [[id number]]  (Normal) Collected:  01/10/19 1850     Updated:  01/10/19 1906     POCT QC Pass     POCT Infectious Mono Heterophile Antibodies Negative    Influenza A/B [[id number]]  (Normal) Collected:  01/10/19 1836     Updated:  01/10/19 1857     POCT QC Pass     POCT Rapid Influenza A AG Negative     POCT Rapid Influenza B AG Negative    Rapid Group A Strep [[id number]]  (Normal) Collected:  01/10/19 1836    Specimen:  Throat Updated:  01/10/19 1856     POCT QC Pass     Rapid Strep A Screen POCT Negative      Comment Negative Results should be confirmed by throat Cx to confirm absence of Strep A inf.              No results found.      Orders Placed This Encounter   Medications   . dexamethasone (DECADRON) injection 15 mg   . ibuprofen (ADVIL,MOTRIN) 100 MG/5ML oral suspension 200 mg   .  cephalexin (KEFLEX) 500 MG capsule     Sig: Take 1 capsule (500 mg total) by mouth 2 (two) times daily for 10 days     Dispense:  20 capsule     Refill:  0         PROCEDURES     Procedures       ASSESSMENT     Encounter Diagnoses  Name Primary?   . Sore throat    . Fever, unspecified    . Streptococcal pharyngitis Yes          ASSESSMENT    PLAN      Clinical Strep/tonsillitis: keflex bid for 10 days.    Throat culture.    ER immediately if signs of dehydration, drooling, worsening pain, muffled voice, throat tightness.    Push fluids.    Well appearing at time of discharge.     Discussed results and diagnosis with patient at the bedside.  Reviewed warning signs for worsening condition as well as indications for follow-up with pmd and return to emergency department.   Patient expressed understanding of instructions.    Orders Placed This Encounter   Procedures   . Throat Culture ([place])   . Rapid Group A Strep   . Influenza A/B   . Mono Screen         An After Visit Summary was printed and given to the patient.      Signed,  Charolette Forward, FNP

## 2019-01-13 NOTE — Progress Notes (Signed)
Throat culture negative, please call and inform patient of results. May STOP his antibiotics
